# Patient Record
Sex: Female | Born: 1960 | Race: White | Hispanic: No | Marital: Single
Health system: Southern US, Community
[De-identification: ages and names within clinical notes are randomized; demographics above are authoritative.]

## PROBLEM LIST (undated history)

## (undated) HISTORY — PX: ABDOMINAL HYSTERECTOMY: SHX81

## (undated) HISTORY — PX: BREAST EXCISIONAL BIOPSY: SUR124

---

## 1988-03-22 HISTORY — PX: REDUCTION MAMMAPLASTY: SUR839

## 2006-04-21 ENCOUNTER — Ambulatory Visit: Payer: Self-pay | Admitting: Internal Medicine

## 2006-04-27 ENCOUNTER — Ambulatory Visit: Payer: Self-pay | Admitting: Internal Medicine

## 2006-06-30 ENCOUNTER — Emergency Department: Payer: Self-pay | Admitting: Emergency Medicine

## 2006-10-19 ENCOUNTER — Ambulatory Visit: Payer: Self-pay | Admitting: Urology

## 2006-11-04 ENCOUNTER — Emergency Department: Payer: Self-pay | Admitting: Emergency Medicine

## 2007-02-27 ENCOUNTER — Ambulatory Visit: Payer: Self-pay | Admitting: Internal Medicine

## 2009-12-10 ENCOUNTER — Ambulatory Visit: Payer: Self-pay | Admitting: Family Medicine

## 2010-12-28 ENCOUNTER — Ambulatory Visit: Payer: Self-pay | Admitting: Family Medicine

## 2014-05-14 ENCOUNTER — Ambulatory Visit: Payer: Self-pay | Admitting: Family Medicine

## 2016-02-16 ENCOUNTER — Ambulatory Visit
Admission: RE | Admit: 2016-02-16 | Discharge: 2016-02-16 | Disposition: A | Discharge status: Home / Self Care | Payer: Self-pay | Source: Ambulatory Visit | Attending: Oncology | Admitting: Oncology

## 2016-02-16 ENCOUNTER — Ambulatory Visit: Payer: Self-pay | Attending: Oncology

## 2016-02-16 VITALS — BP 155/92 | HR 79 | Temp 98.2°F | Ht 65.35 in | Wt 214.0 lb

## 2016-02-16 DIAGNOSIS — Z0000 Encounter for general adult medical examination without abnormal findings: Secondary | ICD-10-CM

## 2016-02-16 NOTE — Progress Notes (Signed)
Subjective:     Patient ID: Cynthia HolterSuzanne R Greer, female   DOB: 07/13/1960, 55 y.o.   MRN: [id number]  HPI   Review of Systems     Objective:   Physical Exam  Pulmonary/Chest: Right breast exhibits no inverted nipple, no mass, no nipple discharge, no skin change and no tenderness. Left breast exhibits no inverted nipple, no mass, no nipple discharge, no skin change and no tenderness. Breasts are symmetrical.         Assessment:     55 [facility]  Patient screened, and meets [facility] eligibility.  Patient does not have insurance, Medicare or Medicaid.  Handout given on Affordable Care Act.  Instructed patient on breast self-exam using teach back method.  CBE unremarkable.  No mass or lump palpated.  History of bilateral breast reduction greater than 20 years ago.    Plan:     Sent for bilateral screening mammogram.

## 2016-02-17 NOTE — Progress Notes (Signed)
Letter mailed from [facility] to notify of normal mammogram results.  Patient to return in one year for annual screening.  Copy to [facility]. 

## 2017-06-29 ENCOUNTER — Ambulatory Visit: Payer: Self-pay | Attending: Oncology | Admitting: *Deleted

## 2017-06-29 ENCOUNTER — Ambulatory Visit: Payer: Self-pay

## 2017-06-29 ENCOUNTER — Ambulatory Visit
Admission: RE | Admit: 2017-06-29 | Discharge: 2017-06-29 | Disposition: A | Discharge status: Home / Self Care | Payer: Self-pay | Source: Ambulatory Visit | Attending: Oncology | Admitting: Oncology

## 2017-06-29 VITALS — BP 106/68 | HR 70 | Temp 97.4°F | Ht 66.0 in | Wt 213.0 lb

## 2017-06-29 DIAGNOSIS — Z0000 Encounter for general adult medical examination without abnormal findings: Secondary | ICD-10-CM

## 2017-06-29 NOTE — Patient Instructions (Signed)
Gave patient hand-out, Women Staying Healthy, Active and Well from [facility], with education on breast health, pap smears, heart and colon health. 

## 2017-06-29 NOTE — Progress Notes (Signed)
Subjective:     Patient ID: Cynthia HolterSuzanne R Mateus, female   DOB: 12/05/1960, 57 y.o.   MRN: [id number]  HPI   Review of Systems     Objective:   Physical Exam  Pulmonary/Chest: Right breast exhibits no inverted nipple, no mass, no nipple discharge, no skin change and no tenderness. Left breast exhibits no inverted nipple, no mass, no nipple discharge, no skin change and no tenderness. Breasts are symmetrical.         Assessment:     57 [facility] White [facility]  Clinical breast exam unremarkable.  States she has had itching at the right lateral breast.  No skin changes noted.  Denies changes in detergent, soap or clothing.  Taught self breast awareness.  She is to call back if she notices any skin changes or nipple itching. 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  Patient has been screened for eligibility.  She does not have any insurance, Medicare or Medicaid.  She also meets financial eligibility.  Hand-out given on the Affordable Care Act.    Plan:     Screening mammogram ordered.  Will follow-up per [facility] protocol.

## 2017-07-06 ENCOUNTER — Encounter: Payer: Self-pay | Admitting: *Deleted

## 2017-07-06 NOTE — Progress Notes (Signed)
Letter mailed from [facility] to inform patient of her normal mammogram results.  Patient is to follow-up with annual screening in one year.  [facility] to Christy. 

## 2018-11-20 ENCOUNTER — Other Ambulatory Visit: Payer: Self-pay | Admitting: Family Medicine

## 2018-11-20 DIAGNOSIS — Z1231 Encounter for screening mammogram for malignant neoplasm of breast: Secondary | ICD-10-CM

## 2018-12-06 ENCOUNTER — Encounter (INDEPENDENT_AMBULATORY_CARE_PROVIDER_SITE_OTHER): Payer: Self-pay

## 2018-12-06 ENCOUNTER — Other Ambulatory Visit: Payer: Self-pay

## 2018-12-06 ENCOUNTER — Ambulatory Visit
Admission: RE | Admit: 2018-12-06 | Discharge: 2018-12-06 | Disposition: A | Discharge status: Home / Self Care | Payer: 59 | Source: Ambulatory Visit | Attending: Family Medicine | Admitting: Family Medicine

## 2018-12-06 DIAGNOSIS — Z1231 Encounter for screening mammogram for malignant neoplasm of breast: Secondary | ICD-10-CM | POA: Insufficient documentation

## 2020-03-10 ENCOUNTER — Other Ambulatory Visit: Payer: Self-pay | Admitting: Family Medicine

## 2020-03-18 ENCOUNTER — Other Ambulatory Visit: Payer: Self-pay | Admitting: Family Medicine

## 2020-03-18 DIAGNOSIS — N6310 Unspecified lump in the right breast, unspecified quadrant: Secondary | ICD-10-CM

## 2020-05-14 ENCOUNTER — Other Ambulatory Visit: Payer: Self-pay | Admitting: Family Medicine

## 2020-05-14 DIAGNOSIS — Z1231 Encounter for screening mammogram for malignant neoplasm of breast: Secondary | ICD-10-CM

## 2020-05-14 DIAGNOSIS — N649 Disorder of breast, unspecified: Secondary | ICD-10-CM

## 2020-11-14 DIAGNOSIS — Z0000 Encounter for general adult medical examination without abnormal findings: Secondary | ICD-10-CM | POA: Diagnosis not present

## 2020-11-14 DIAGNOSIS — Z113 Encounter for screening for infections with a predominantly sexual mode of transmission: Secondary | ICD-10-CM | POA: Diagnosis not present

## 2020-11-14 DIAGNOSIS — Z1389 Encounter for screening for other disorder: Secondary | ICD-10-CM | POA: Diagnosis not present

## 2020-11-14 DIAGNOSIS — R399 Unspecified symptoms and signs involving the genitourinary system: Secondary | ICD-10-CM | POA: Diagnosis not present

## 2021-05-03 DIAGNOSIS — M542 Cervicalgia: Secondary | ICD-10-CM | POA: Diagnosis not present

## 2021-05-03 DIAGNOSIS — S199XXA Unspecified injury of neck, initial encounter: Secondary | ICD-10-CM | POA: Diagnosis not present

## 2021-05-03 DIAGNOSIS — R0789 Other chest pain: Secondary | ICD-10-CM | POA: Diagnosis not present

## 2021-05-03 DIAGNOSIS — M62838 Other muscle spasm: Secondary | ICD-10-CM | POA: Diagnosis not present

## 2021-05-03 DIAGNOSIS — S4992XA Unspecified injury of left shoulder and upper arm, initial encounter: Secondary | ICD-10-CM | POA: Diagnosis not present

## 2021-05-03 DIAGNOSIS — S40212A Abrasion of left shoulder, initial encounter: Secondary | ICD-10-CM | POA: Diagnosis not present

## 2021-05-08 DIAGNOSIS — Z1389 Encounter for screening for other disorder: Secondary | ICD-10-CM | POA: Diagnosis not present

## 2021-05-08 DIAGNOSIS — R0789 Other chest pain: Secondary | ICD-10-CM | POA: Diagnosis not present

## 2021-05-08 DIAGNOSIS — Z0000 Encounter for general adult medical examination without abnormal findings: Secondary | ICD-10-CM | POA: Diagnosis not present

## 2021-05-08 DIAGNOSIS — M5489 Other dorsalgia: Secondary | ICD-10-CM | POA: Diagnosis not present

## 2021-05-08 DIAGNOSIS — Z0130 Encounter for examination of blood pressure without abnormal findings: Secondary | ICD-10-CM | POA: Diagnosis not present

## 2021-05-08 DIAGNOSIS — S46019D Strain of muscle(s) and tendon(s) of the rotator cuff of unspecified shoulder, subsequent encounter: Secondary | ICD-10-CM | POA: Diagnosis not present

## 2021-05-20 DIAGNOSIS — Z1389 Encounter for screening for other disorder: Secondary | ICD-10-CM | POA: Diagnosis not present

## 2021-05-20 DIAGNOSIS — Z0000 Encounter for general adult medical examination without abnormal findings: Secondary | ICD-10-CM | POA: Diagnosis not present

## 2021-05-20 DIAGNOSIS — M7712 Lateral epicondylitis, left elbow: Secondary | ICD-10-CM | POA: Diagnosis not present

## 2021-05-20 DIAGNOSIS — Z0130 Encounter for examination of blood pressure without abnormal findings: Secondary | ICD-10-CM | POA: Diagnosis not present

## 2021-05-20 DIAGNOSIS — M25532 Pain in left wrist: Secondary | ICD-10-CM | POA: Diagnosis not present

## 2021-05-27 ENCOUNTER — Other Ambulatory Visit: Payer: Self-pay | Admitting: Family Medicine

## 2021-05-27 DIAGNOSIS — M25532 Pain in left wrist: Secondary | ICD-10-CM

## 2021-06-30 DIAGNOSIS — Z20822 Contact with and (suspected) exposure to covid-19: Secondary | ICD-10-CM | POA: Diagnosis not present

## 2021-06-30 DIAGNOSIS — H1032 Unspecified acute conjunctivitis, left eye: Secondary | ICD-10-CM | POA: Diagnosis not present

## 2021-06-30 DIAGNOSIS — Z0000 Encounter for general adult medical examination without abnormal findings: Secondary | ICD-10-CM | POA: Diagnosis not present

## 2021-06-30 DIAGNOSIS — J019 Acute sinusitis, unspecified: Secondary | ICD-10-CM | POA: Diagnosis not present

## 2021-07-06 IMAGING — MG MM DIGITAL SCREENING BILAT W/ TOMO W/ CAD
8 series · 8 of 24 positions shown · non-contrast
Comparison: Previous exam(s).

CLINICAL DATA: Screening.

EXAM:
DIGITAL SCREENING BILATERAL MAMMOGRAM WITH TOMO AND CAD

[R CC synth-2D]
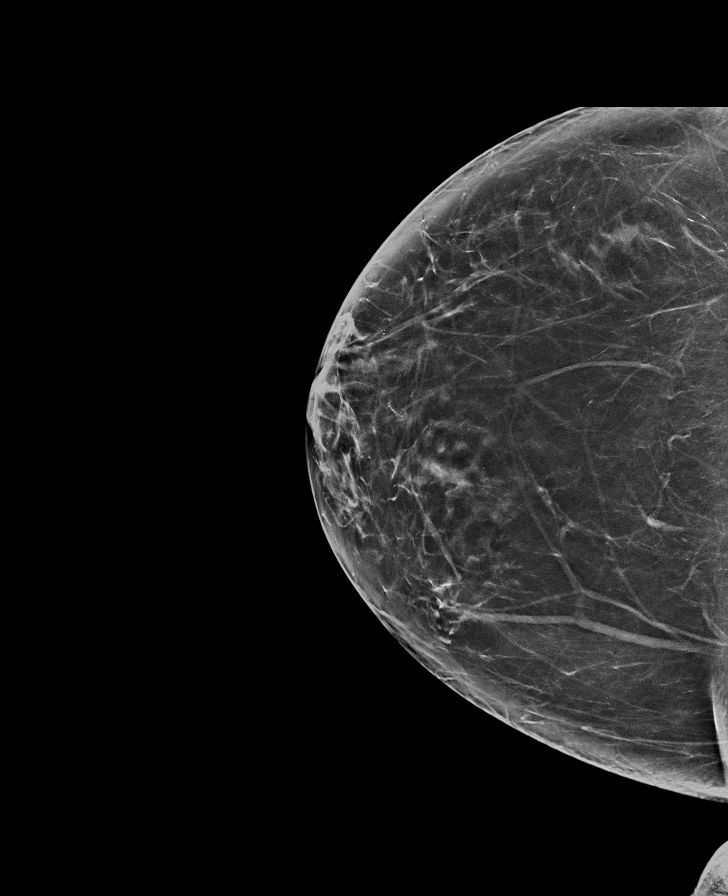

[L MLO synth-2D]
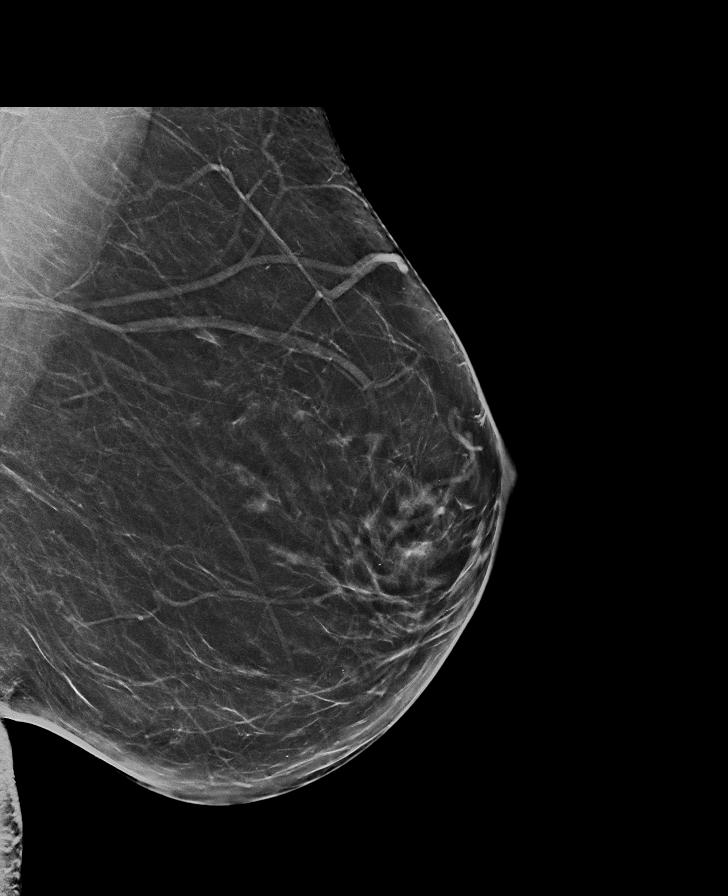

[L CC synth-2D]
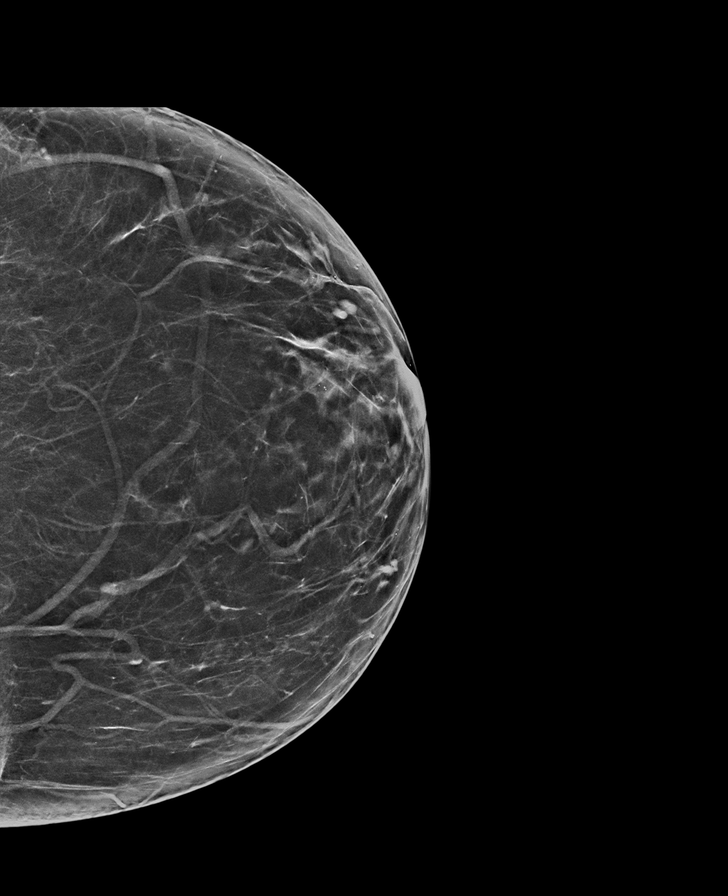

[R MLO synth-2D]
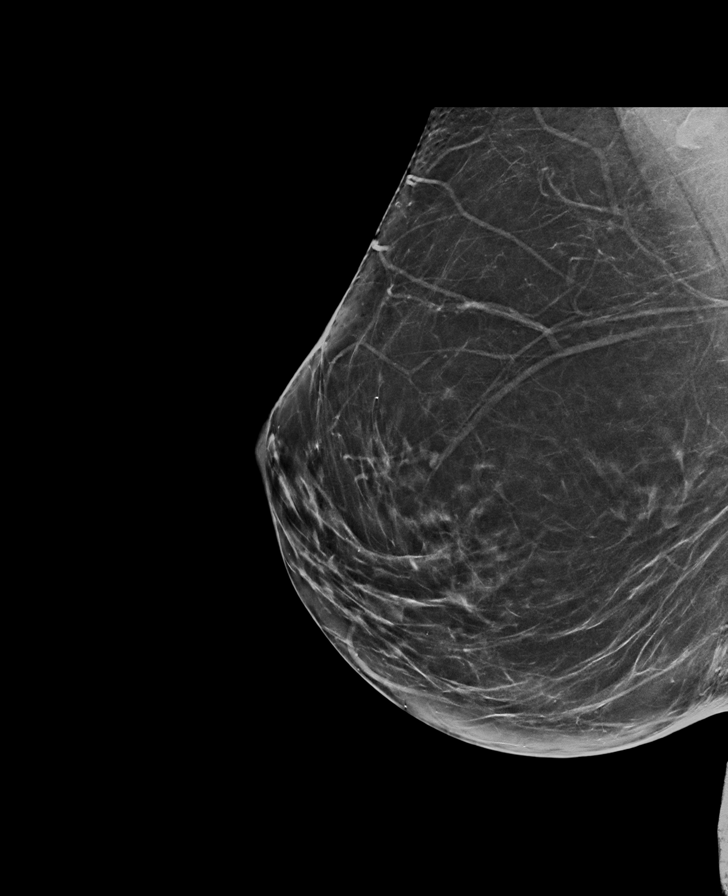

[L CC tomo · tomo slice 35/70.0]
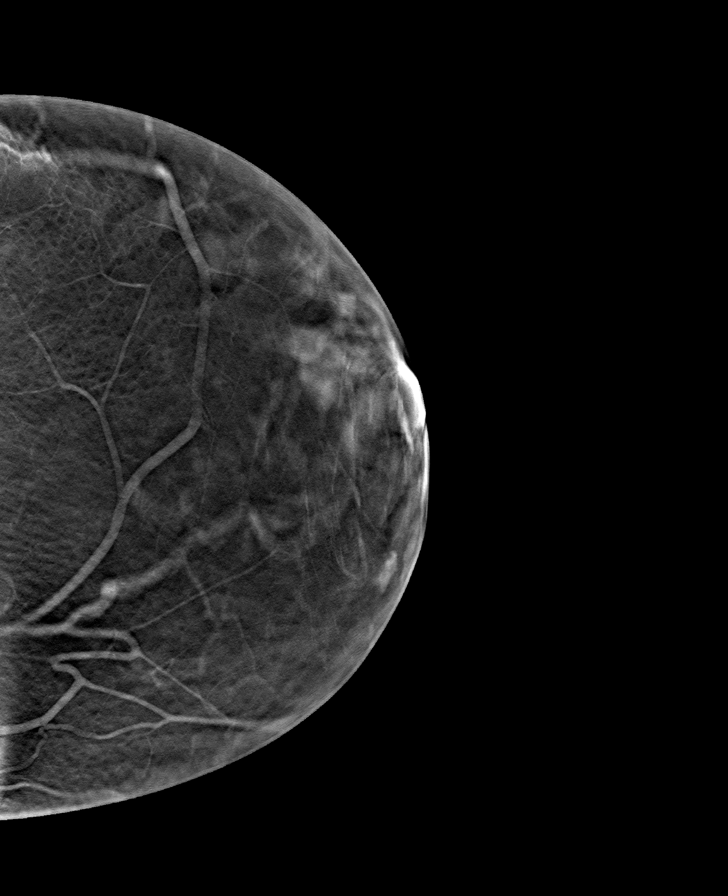

[R CC tomo · tomo slice 37/74.0]
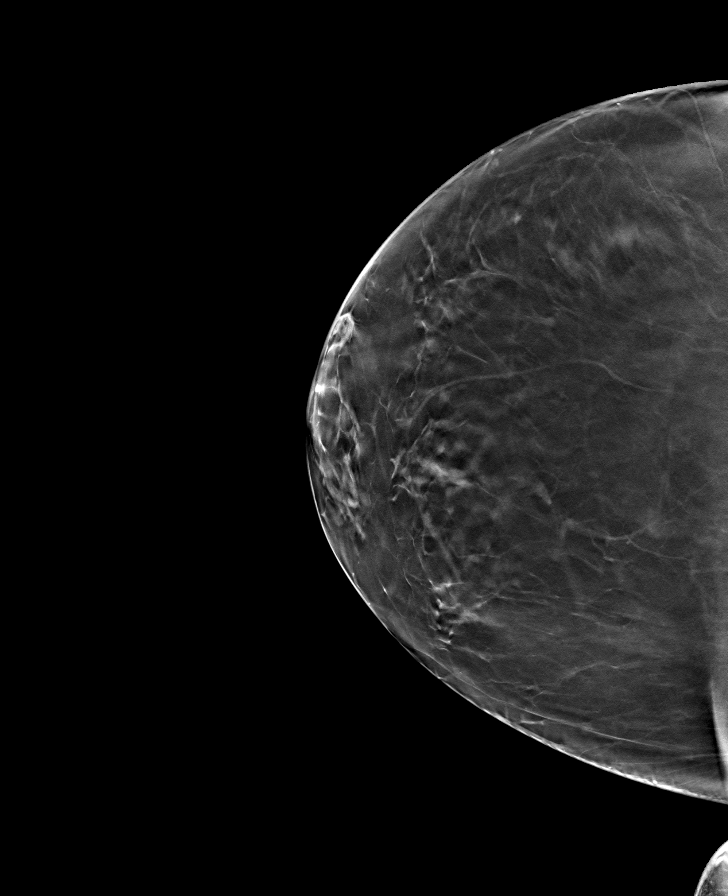

[L MLO tomo · tomo slice 38/75.0]
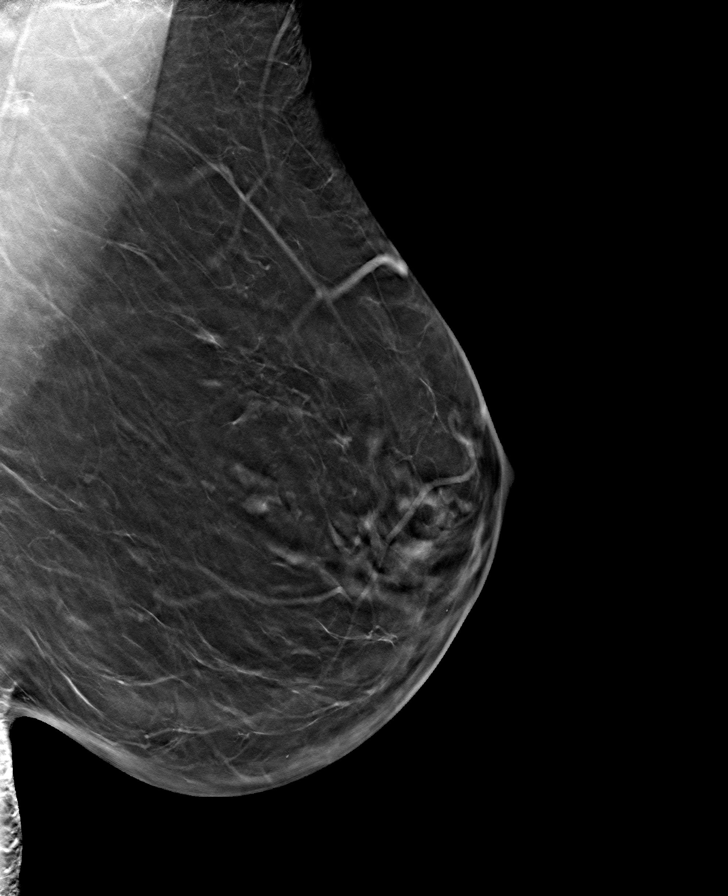

[R MLO tomo · tomo slice 37/74.0]
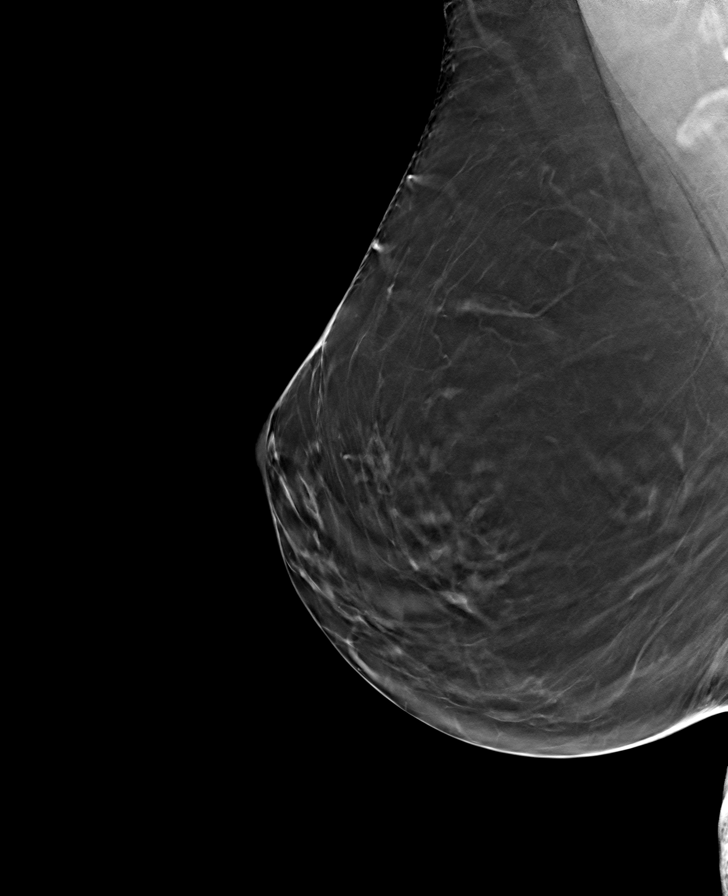

[8 of 24 positions shown; findings below may reference images not displayed]

ACR Breast Density Category b: There are scattered areas of
fibroglandular density.
FINDINGS: There are no findings suspicious for malignancy. Images were
processed with CAD.
IMPRESSION: No mammographic evidence of malignancy. A result letter of this
screening mammogram will be mailed directly to the patient.

RECOMMENDATION:
Screening mammogram in one year. (Code:[id number])

BI-RADS CATEGORY  1: Negative.

## 2021-07-31 DIAGNOSIS — M545 Low back pain, unspecified: Secondary | ICD-10-CM | POA: Diagnosis not present

## 2021-07-31 DIAGNOSIS — M25512 Pain in left shoulder: Secondary | ICD-10-CM | POA: Diagnosis not present

## 2021-08-07 DIAGNOSIS — Z0130 Encounter for examination of blood pressure without abnormal findings: Secondary | ICD-10-CM | POA: Diagnosis not present

## 2021-08-07 DIAGNOSIS — Z0131 Encounter for examination of blood pressure with abnormal findings: Secondary | ICD-10-CM | POA: Diagnosis not present

## 2021-08-07 DIAGNOSIS — Z1389 Encounter for screening for other disorder: Secondary | ICD-10-CM | POA: Diagnosis not present

## 2021-08-07 DIAGNOSIS — N200 Calculus of kidney: Secondary | ICD-10-CM | POA: Diagnosis not present

## 2021-08-11 DIAGNOSIS — N200 Calculus of kidney: Secondary | ICD-10-CM | POA: Diagnosis not present

## 2021-08-15 DIAGNOSIS — R7989 Other specified abnormal findings of blood chemistry: Secondary | ICD-10-CM | POA: Diagnosis not present

## 2021-08-15 DIAGNOSIS — M546 Pain in thoracic spine: Secondary | ICD-10-CM | POA: Diagnosis not present

## 2021-08-15 DIAGNOSIS — M5459 Other low back pain: Secondary | ICD-10-CM | POA: Diagnosis not present

## 2021-08-15 DIAGNOSIS — R109 Unspecified abdominal pain: Secondary | ICD-10-CM | POA: Diagnosis not present

## 2021-08-15 DIAGNOSIS — Z87442 Personal history of urinary calculi: Secondary | ICD-10-CM | POA: Diagnosis not present

## 2022-01-12 DIAGNOSIS — M25512 Pain in left shoulder: Secondary | ICD-10-CM | POA: Diagnosis not present

## 2022-01-12 DIAGNOSIS — M5412 Radiculopathy, cervical region: Secondary | ICD-10-CM | POA: Diagnosis not present

## 2022-01-14 DIAGNOSIS — M7540 Impingement syndrome of unspecified shoulder: Secondary | ICD-10-CM | POA: Diagnosis not present

## 2022-01-26 DIAGNOSIS — Z0131 Encounter for examination of blood pressure with abnormal findings: Secondary | ICD-10-CM | POA: Diagnosis not present

## 2022-01-26 DIAGNOSIS — Z1389 Encounter for screening for other disorder: Secondary | ICD-10-CM | POA: Diagnosis not present

## 2022-01-26 DIAGNOSIS — S7010XA Contusion of unspecified thigh, initial encounter: Secondary | ICD-10-CM | POA: Diagnosis not present

## 2022-03-08 DIAGNOSIS — Z1389 Encounter for screening for other disorder: Secondary | ICD-10-CM | POA: Diagnosis not present

## 2022-03-08 DIAGNOSIS — Z0131 Encounter for examination of blood pressure with abnormal findings: Secondary | ICD-10-CM | POA: Diagnosis not present

## 2022-03-08 DIAGNOSIS — Z112 Encounter for screening for other bacterial diseases: Secondary | ICD-10-CM | POA: Diagnosis not present

## 2022-03-08 DIAGNOSIS — Z20822 Contact with and (suspected) exposure to covid-19: Secondary | ICD-10-CM | POA: Diagnosis not present

## 2022-03-08 DIAGNOSIS — U071 COVID-19: Secondary | ICD-10-CM | POA: Diagnosis not present

## 2022-06-11 DIAGNOSIS — M25512 Pain in left shoulder: Secondary | ICD-10-CM | POA: Diagnosis not present

## 2022-06-11 DIAGNOSIS — M7542 Impingement syndrome of left shoulder: Secondary | ICD-10-CM | POA: Diagnosis not present

## 2022-07-23 DIAGNOSIS — I10 Essential (primary) hypertension: Secondary | ICD-10-CM | POA: Diagnosis not present

## 2022-07-23 DIAGNOSIS — R7309 Other abnormal glucose: Secondary | ICD-10-CM | POA: Diagnosis not present

## 2022-07-23 DIAGNOSIS — E785 Hyperlipidemia, unspecified: Secondary | ICD-10-CM | POA: Diagnosis not present

## 2022-07-23 DIAGNOSIS — Z1331 Encounter for screening for depression: Secondary | ICD-10-CM | POA: Diagnosis not present

## 2022-07-23 DIAGNOSIS — Z8639 Personal history of other endocrine, nutritional and metabolic disease: Secondary | ICD-10-CM | POA: Diagnosis not present

## 2022-07-23 DIAGNOSIS — Z0131 Encounter for examination of blood pressure with abnormal findings: Secondary | ICD-10-CM | POA: Diagnosis not present

## 2022-07-23 DIAGNOSIS — Z1389 Encounter for screening for other disorder: Secondary | ICD-10-CM | POA: Diagnosis not present

## 2022-07-29 ENCOUNTER — Other Ambulatory Visit: Payer: Self-pay | Admitting: Family Medicine

## 2022-07-29 DIAGNOSIS — Z1231 Encounter for screening mammogram for malignant neoplasm of breast: Secondary | ICD-10-CM

## 2022-11-01 DIAGNOSIS — Z0130 Encounter for examination of blood pressure without abnormal findings: Secondary | ICD-10-CM | POA: Diagnosis not present

## 2022-11-01 DIAGNOSIS — J0100 Acute maxillary sinusitis, unspecified: Secondary | ICD-10-CM | POA: Diagnosis not present

## 2022-11-01 DIAGNOSIS — Z0131 Encounter for examination of blood pressure with abnormal findings: Secondary | ICD-10-CM | POA: Diagnosis not present

## 2022-11-01 DIAGNOSIS — Z1389 Encounter for screening for other disorder: Secondary | ICD-10-CM | POA: Diagnosis not present

## 2022-11-01 DIAGNOSIS — Z20822 Contact with and (suspected) exposure to covid-19: Secondary | ICD-10-CM | POA: Diagnosis not present

## 2022-11-12 DIAGNOSIS — M25562 Pain in left knee: Secondary | ICD-10-CM | POA: Diagnosis not present

## 2022-11-12 DIAGNOSIS — M25512 Pain in left shoulder: Secondary | ICD-10-CM | POA: Diagnosis not present

## 2022-11-12 DIAGNOSIS — M2392 Unspecified internal derangement of left knee: Secondary | ICD-10-CM | POA: Diagnosis not present

## 2022-11-12 DIAGNOSIS — M24812 Other specific joint derangements of left shoulder, not elsewhere classified: Secondary | ICD-10-CM | POA: Diagnosis not present

## 2022-11-29 DIAGNOSIS — I10 Essential (primary) hypertension: Secondary | ICD-10-CM | POA: Diagnosis not present

## 2022-11-29 DIAGNOSIS — Z23 Encounter for immunization: Secondary | ICD-10-CM | POA: Diagnosis not present

## 2022-11-29 DIAGNOSIS — M25569 Pain in unspecified knee: Secondary | ICD-10-CM | POA: Diagnosis not present

## 2022-11-29 DIAGNOSIS — Z0130 Encounter for examination of blood pressure without abnormal findings: Secondary | ICD-10-CM | POA: Diagnosis not present

## 2022-11-29 DIAGNOSIS — Z1389 Encounter for screening for other disorder: Secondary | ICD-10-CM | POA: Diagnosis not present

## 2023-01-27 DIAGNOSIS — Z1389 Encounter for screening for other disorder: Secondary | ICD-10-CM | POA: Diagnosis not present

## 2023-01-27 DIAGNOSIS — M25569 Pain in unspecified knee: Secondary | ICD-10-CM | POA: Diagnosis not present

## 2023-01-27 DIAGNOSIS — Z0130 Encounter for examination of blood pressure without abnormal findings: Secondary | ICD-10-CM | POA: Diagnosis not present

## 2023-02-09 DIAGNOSIS — M25562 Pain in left knee: Secondary | ICD-10-CM | POA: Diagnosis not present

## 2023-02-09 DIAGNOSIS — M2392 Unspecified internal derangement of left knee: Secondary | ICD-10-CM | POA: Diagnosis not present

## 2023-02-18 DIAGNOSIS — M25562 Pain in left knee: Secondary | ICD-10-CM | POA: Diagnosis not present

## 2023-02-23 DIAGNOSIS — S83242A Other tear of medial meniscus, current injury, left knee, initial encounter: Secondary | ICD-10-CM | POA: Diagnosis not present

## 2023-04-12 DIAGNOSIS — Z1389 Encounter for screening for other disorder: Secondary | ICD-10-CM | POA: Diagnosis not present

## 2023-04-12 DIAGNOSIS — Z1159 Encounter for screening for other viral diseases: Secondary | ICD-10-CM | POA: Diagnosis not present

## 2023-04-12 DIAGNOSIS — J069 Acute upper respiratory infection, unspecified: Secondary | ICD-10-CM | POA: Diagnosis not present

## 2023-04-12 DIAGNOSIS — Z0131 Encounter for examination of blood pressure with abnormal findings: Secondary | ICD-10-CM | POA: Diagnosis not present

## 2023-04-22 DIAGNOSIS — M2392 Unspecified internal derangement of left knee: Secondary | ICD-10-CM | POA: Diagnosis not present

## 2023-05-02 DIAGNOSIS — M65862 Other synovitis and tenosynovitis, left lower leg: Secondary | ICD-10-CM | POA: Diagnosis not present

## 2023-05-02 DIAGNOSIS — M6752 Plica syndrome, left knee: Secondary | ICD-10-CM | POA: Diagnosis not present

## 2023-05-02 DIAGNOSIS — S83232A Complex tear of medial meniscus, current injury, left knee, initial encounter: Secondary | ICD-10-CM | POA: Diagnosis not present

## 2023-05-02 DIAGNOSIS — M1712 Unilateral primary osteoarthritis, left knee: Secondary | ICD-10-CM | POA: Diagnosis not present

## 2023-05-02 DIAGNOSIS — M94262 Chondromalacia, left knee: Secondary | ICD-10-CM | POA: Diagnosis not present

## 2023-05-02 DIAGNOSIS — S83242A Other tear of medial meniscus, current injury, left knee, initial encounter: Secondary | ICD-10-CM | POA: Diagnosis not present

## 2023-05-02 DIAGNOSIS — M794 Hypertrophy of (infrapatellar) fat pad: Secondary | ICD-10-CM | POA: Diagnosis not present

## 2023-05-06 DIAGNOSIS — M25562 Pain in left knee: Secondary | ICD-10-CM | POA: Diagnosis not present

## 2023-05-13 DIAGNOSIS — M25562 Pain in left knee: Secondary | ICD-10-CM | POA: Diagnosis not present

## 2023-05-17 DIAGNOSIS — M25562 Pain in left knee: Secondary | ICD-10-CM | POA: Diagnosis not present

## 2023-05-19 DIAGNOSIS — M25562 Pain in left knee: Secondary | ICD-10-CM | POA: Diagnosis not present

## 2023-05-23 DIAGNOSIS — M25562 Pain in left knee: Secondary | ICD-10-CM | POA: Diagnosis not present

## 2023-05-30 DIAGNOSIS — M25562 Pain in left knee: Secondary | ICD-10-CM | POA: Diagnosis not present

## 2023-06-02 DIAGNOSIS — M25562 Pain in left knee: Secondary | ICD-10-CM | POA: Diagnosis not present

## 2023-06-06 DIAGNOSIS — M25562 Pain in left knee: Secondary | ICD-10-CM | POA: Diagnosis not present

## 2023-06-13 DIAGNOSIS — M25562 Pain in left knee: Secondary | ICD-10-CM | POA: Diagnosis not present

## 2023-06-23 DIAGNOSIS — M25562 Pain in left knee: Secondary | ICD-10-CM | POA: Diagnosis not present

## 2023-06-29 DIAGNOSIS — M25562 Pain in left knee: Secondary | ICD-10-CM | POA: Diagnosis not present

## 2023-07-01 DIAGNOSIS — M25562 Pain in left knee: Secondary | ICD-10-CM | POA: Diagnosis not present

## 2023-07-06 DIAGNOSIS — M25562 Pain in left knee: Secondary | ICD-10-CM | POA: Diagnosis not present

## 2023-07-08 DIAGNOSIS — M25562 Pain in left knee: Secondary | ICD-10-CM | POA: Diagnosis not present

## 2023-07-13 DIAGNOSIS — M2550 Pain in unspecified joint: Secondary | ICD-10-CM | POA: Diagnosis not present

## 2023-07-13 DIAGNOSIS — M25562 Pain in left knee: Secondary | ICD-10-CM | POA: Diagnosis not present

## 2023-07-20 DIAGNOSIS — M25562 Pain in left knee: Secondary | ICD-10-CM | POA: Diagnosis not present

## 2023-08-11 DIAGNOSIS — S83232D Complex tear of medial meniscus, current injury, left knee, subsequent encounter: Secondary | ICD-10-CM | POA: Diagnosis not present

## 2024-02-24 ENCOUNTER — Other Ambulatory Visit: Payer: Self-pay | Admitting: Family Medicine

## 2024-02-24 DIAGNOSIS — R319 Hematuria, unspecified: Secondary | ICD-10-CM

## 2024-02-24 DIAGNOSIS — Z1231 Encounter for screening mammogram for malignant neoplasm of breast: Secondary | ICD-10-CM

## 2024-03-05 ENCOUNTER — Ambulatory Visit
